# Patient Record
Sex: Male | Born: 1990 | Race: Black or African American | Hispanic: No | Marital: Single | Smoking: Current every day smoker
Health system: Southern US, Community
[De-identification: ages and names within clinical notes are randomized; demographics above are authoritative.]

## PROBLEM LIST (undated history)

## (undated) HISTORY — PX: OTHER SURGICAL HISTORY: SHX169

---
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
## 2012-11-28 IMAGING — CR DG KNEE COMPLETE 4+V*R*
5 series · 5 of 5 positions shown · non-contrast
Comparison: None.

CLINICAL DATA: Knee injury and pain.  Soft tissue swelling.

RIGHT KNEE - COMPLETE 4+ VIEW

[view not recorded (1 of 5)]
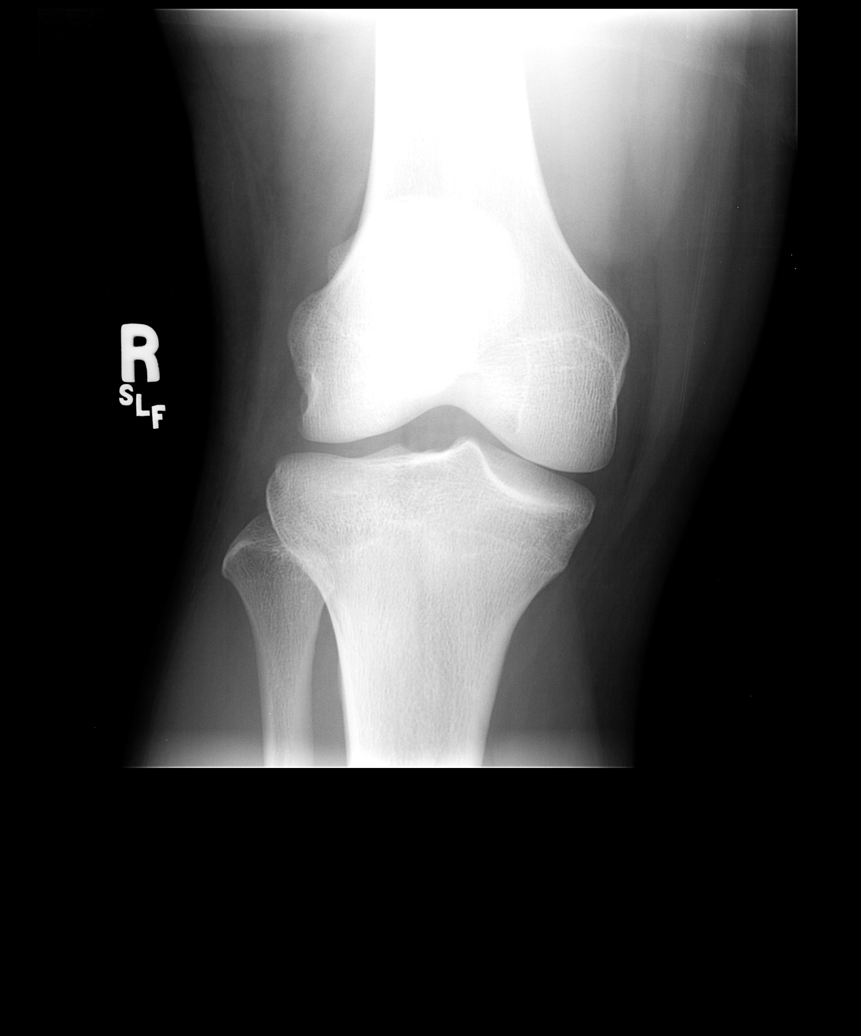

[view not recorded (2 of 5)]
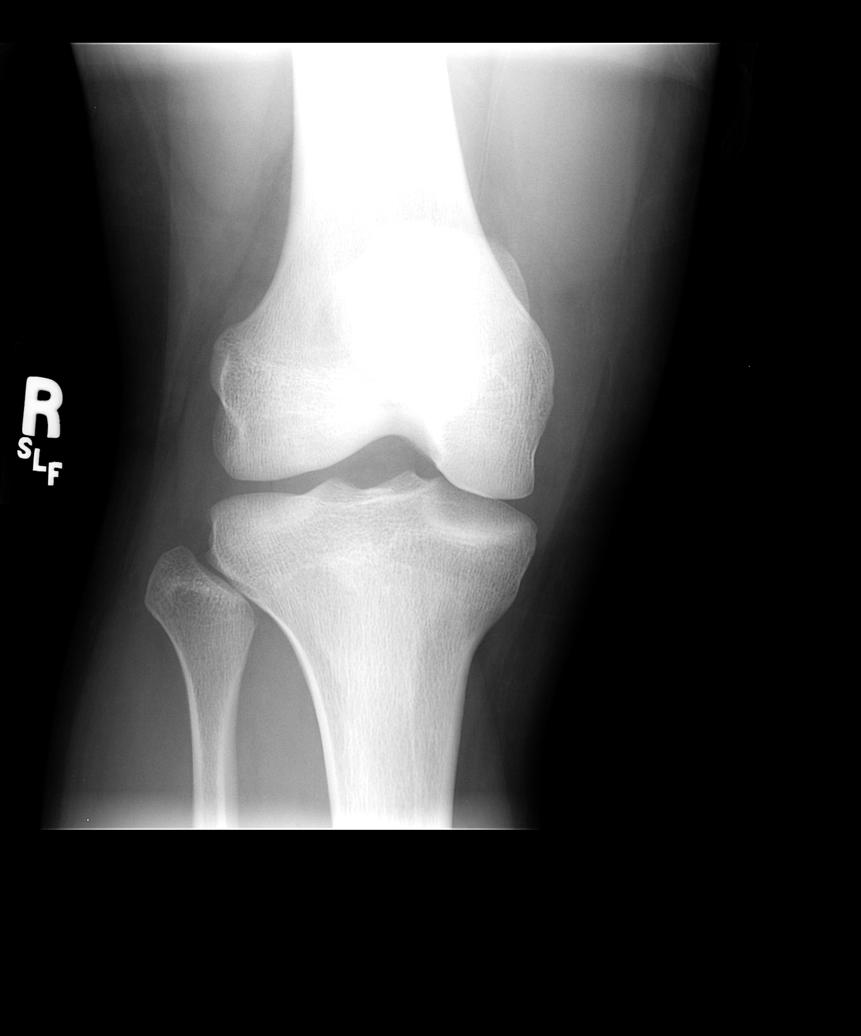

[view not recorded (3 of 5)]
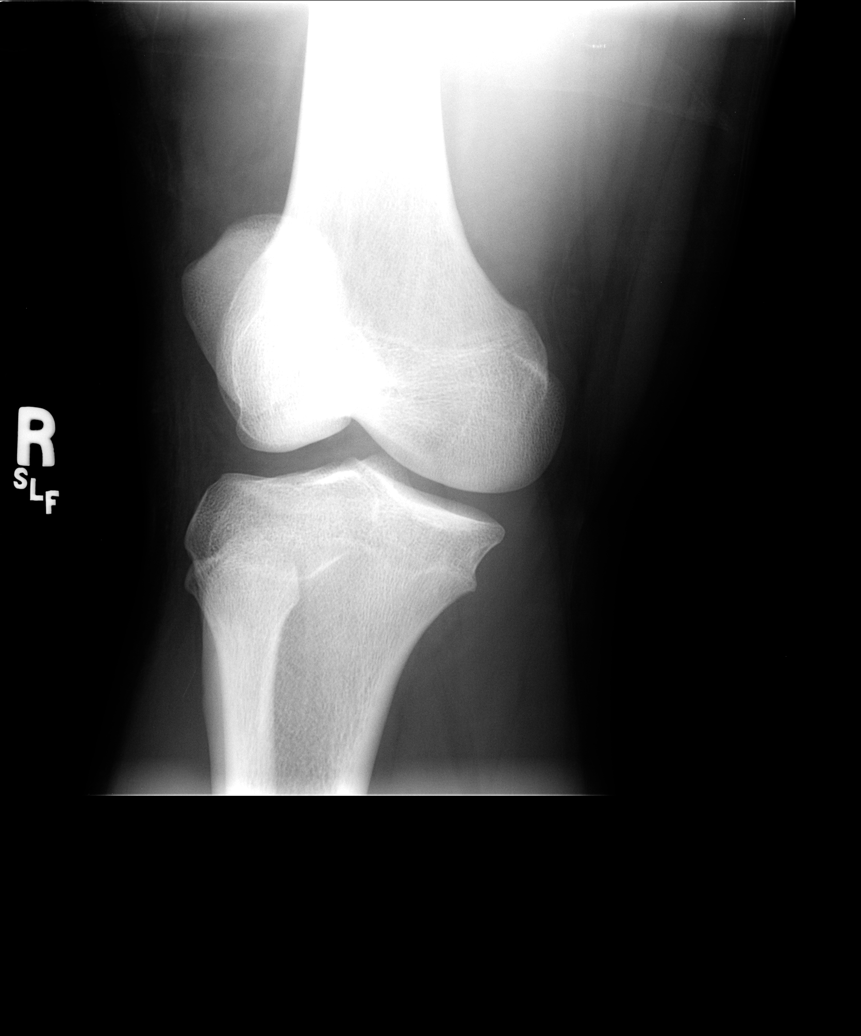

[view not recorded (4 of 5)]
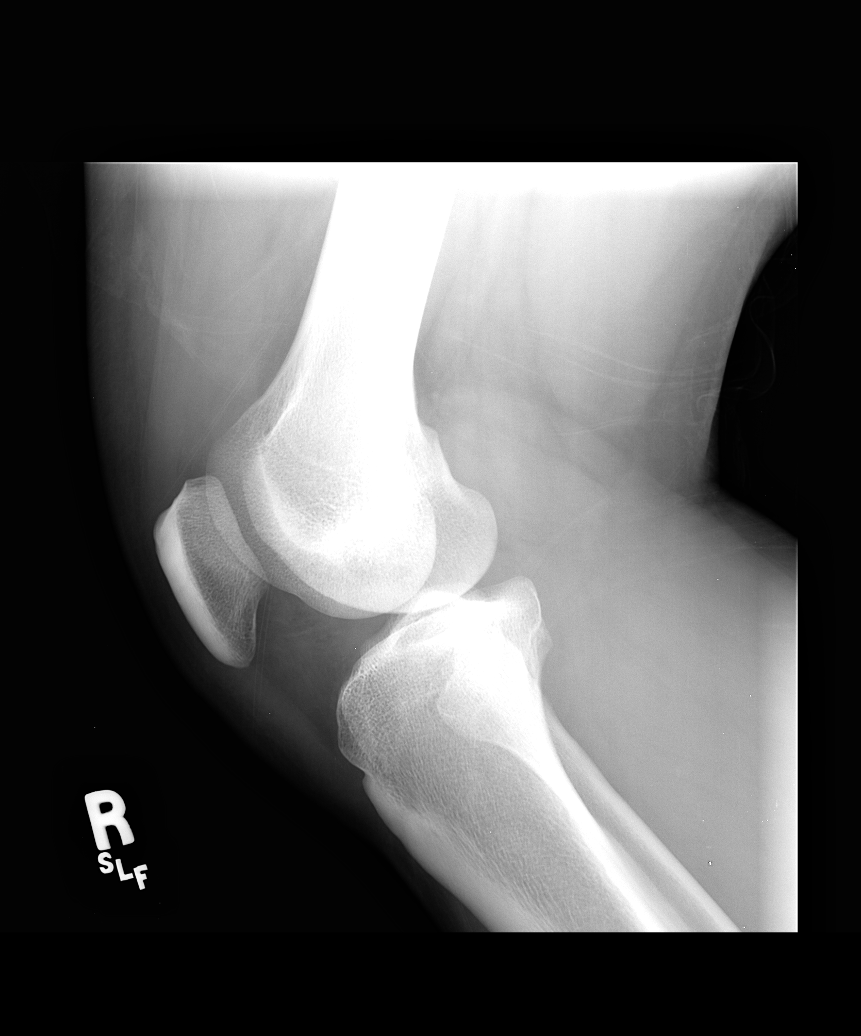

[view not recorded (5 of 5)]
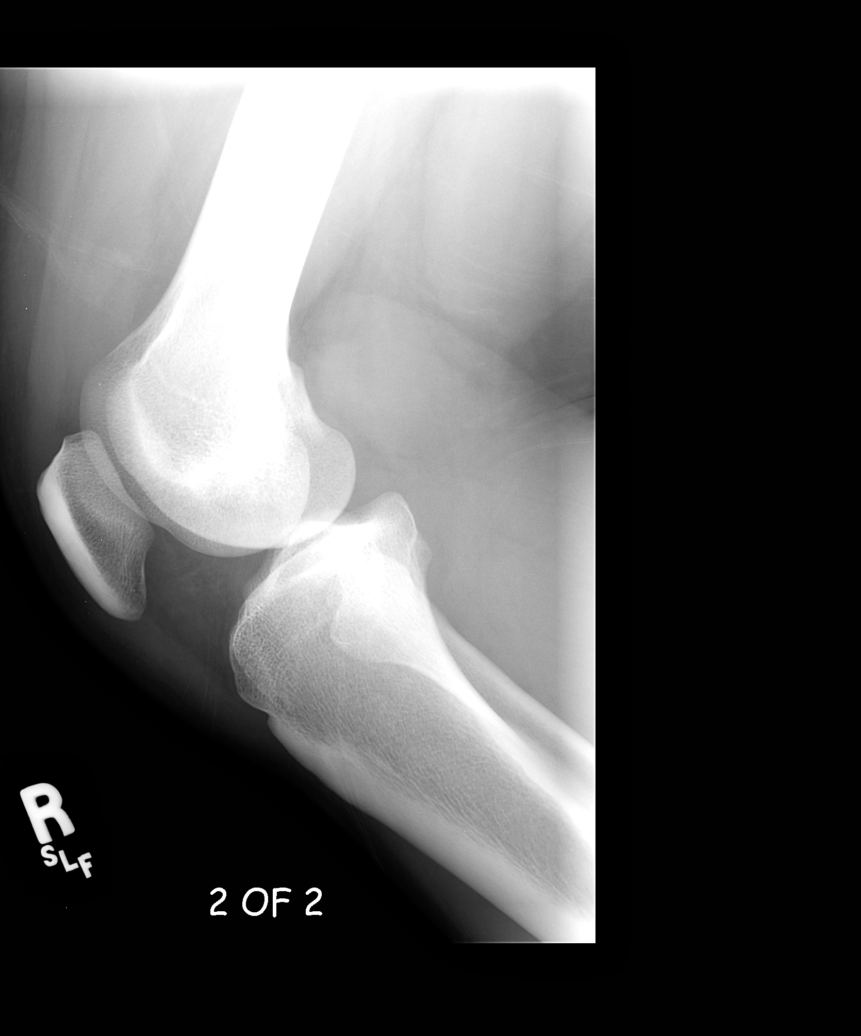

[5 of 5 positions shown; findings below may reference images not displayed]

FINDINGS: There is no evidence of fracture, dislocation, or joint
effusion.  There is no evidence of arthropathy or other focal bone
abnormality.  Soft tissues are unremarkable.
IMPRESSION: Negative.

## 2015-11-20 ENCOUNTER — Ambulatory Visit (INDEPENDENT_AMBULATORY_CARE_PROVIDER_SITE_OTHER): Payer: Managed Care, Other (non HMO) | Admitting: Family Medicine

## 2015-11-20 VITALS — BP 116/68 | HR 62 | Temp 98.2°F | Resp 16 | Ht 67.5 in | Wt 209.4 lb

## 2015-11-20 DIAGNOSIS — B019 Varicella without complication: Secondary | ICD-10-CM

## 2015-11-20 DIAGNOSIS — G479 Sleep disorder, unspecified: Secondary | ICD-10-CM | POA: Diagnosis not present

## 2015-11-20 NOTE — Progress Notes (Signed)
      Chief Complaint:  Chief Complaint  Patient presents with  . titer    chicken pox    HPI: Bryan Reid is a 24 y.o. male who [facility] to [facility] complaining of here for chicken pox titer for [employer]ship in PT.  He is at [employer]. Almost finished with degree He has had trouble stayig asleep, no problems falling asleep. No OSA sxs, he has good sleep hygiein but goes to be d late. He works out 4-5 hrs before he sleeps and eats 2-3 hrs before he sleeps. He has not tried anythign for this. He sstates he is not stressed.  Doe snot snore, keeps room dark, cold, phone is on sleep mode, he doe snot know what wakes him up. This has been going on for seveal months. No thyroid issues in family.    History reviewed. No pertinent past medical history. Past Surgical History  Procedure Laterality Date  . Knee surgery Right    Social History   Social History  . Marital Status: Single    Spouse Name: N/A  . Number of Children: N/A  . Years of Education: N/A   Social History Main Topics  . Smoking status: Current Every Day Smoker  . Smokeless tobacco: None  . Alcohol Use: None  . Drug Use: None  . Sexual Activity: Not Asked   Other Topics Concern  . None   Social History Narrative  . None   History reviewed. No pertinent family history. No Known Allergies Prior to Admission medications   Not on File     ROS: The patient denies fevers, chills, night sweats, unintentional weight loss, chest pain, palpitations, wheezing, dyspnea on exertion, nausea, vomiting, abdominal pain, dysuria, hematuria, melena, numbness, weakness, or tingling.  All other systems have been reviewed and were otherwise negative with the exception of those mentioned in the HPI and as above.    PHYSICAL EXAM: Filed Vitals:   11/20/15 1050  BP: 116/68  Pulse: 62  Temp: 98.2 F (36.8 C)  Resp: 16   Body mass index is 32.29 kg/(m^2).   General: Alert, no acute distress HEENT:   Normocephalic, atraumatic, oropharynx patent. EOMI, PERRLA Cardiovascular:  Regular rate and rhythm, no rubs murmurs or gallops.   Respiratory: Clear to auscultation bilaterally.  No wheezes, rales, or rhonchi.  No cyanosis, no use of accessory musculature Abdominal: No organomegaly, abdomen is soft and non-tender, positive bowel sounds. No masses. Skin: No rashes. Neurologic: Facial musculature symmetric. Psychiatric: Patient acts appropriately throughout our interaction. Lymphatic: No cervical or submandibular lymphadenopathy Musculoskeletal: Gait intact. No edema, tenderness   LABS: No results found for this or any previous visit.   EKG/XRAY:   Primary read interpreted [facility].   ASSESSMENT/PLAN: Encounter Diagnoses  Name Primary?  . Chicken pox Yes  . Sleep disturbance    Varicella titer Sleep hygiene log IF no improvement then consider melatonin Fu prn    Gross sideeffects, risk and benefits, and alternatives of medications d/w patient. Patient is aware that all medications have potential sideeffects and we are unable to predict every sideeffect or drug-drug interaction that may occur.  Thao Le DO  11/20/2015 11:30 AM

## 2015-11-23 LAB — VARICELLA ZOSTER ANTIBODY, IGG: Varicella IgG: 581.7 Index — ABNORMAL HIGH (ref <135.00)

## 2017-08-16 ENCOUNTER — Encounter: Payer: Self-pay | Admitting: Family Medicine
